# Patient Record
Sex: Female | Born: 1995
Health system: Southern US, Community
[De-identification: ages and names within clinical notes are randomized; demographics above are authoritative.]

## PROBLEM LIST (undated history)

## (undated) DIAGNOSIS — IMO0001 Reserved for inherently not codable concepts without codable children: Secondary | ICD-10-CM

## (undated) HISTORY — PX: APPENDECTOMY: SHX54

## (undated) HISTORY — DX: Reserved for inherently not codable concepts without codable children: IMO0001

---
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
## 2016-01-10 DIAGNOSIS — R208 Other disturbances of skin sensation: Secondary | ICD-10-CM | POA: Diagnosis not present

## 2016-01-10 DIAGNOSIS — B078 Other viral warts: Secondary | ICD-10-CM | POA: Diagnosis not present

## 2016-03-26 ENCOUNTER — Encounter: Payer: Self-pay | Admitting: Emergency Medicine

## 2016-03-26 DIAGNOSIS — Z885 Allergy status to narcotic agent status: Secondary | ICD-10-CM | POA: Diagnosis not present

## 2016-03-26 DIAGNOSIS — K353 Acute appendicitis with localized peritonitis: Principal | ICD-10-CM | POA: Insufficient documentation

## 2016-03-26 DIAGNOSIS — R109 Unspecified abdominal pain: Secondary | ICD-10-CM | POA: Diagnosis not present

## 2016-03-26 DIAGNOSIS — Z880 Allergy status to penicillin: Secondary | ICD-10-CM | POA: Diagnosis not present

## 2016-03-26 DIAGNOSIS — K3580 Unspecified acute appendicitis: Secondary | ICD-10-CM | POA: Diagnosis not present

## 2016-03-26 DIAGNOSIS — R1031 Right lower quadrant pain: Secondary | ICD-10-CM | POA: Diagnosis not present

## 2016-03-26 LAB — URINALYSIS, COMPLETE (UACMP) WITH MICROSCOPIC
BILIRUBIN URINE: NEGATIVE
Bacteria, UA: NONE SEEN
Glucose, UA: NEGATIVE mg/dL
HGB URINE DIPSTICK: NEGATIVE
Ketones, ur: 80 mg/dL — AB
NITRITE: NEGATIVE
PH: 5 (ref 5.0–8.0)
Protein, ur: NEGATIVE mg/dL
SPECIFIC GRAVITY, URINE: 1.023 (ref 1.005–1.030)

## 2016-03-26 LAB — COMPREHENSIVE METABOLIC PANEL
ALBUMIN: 4.7 g/dL (ref 3.5–5.0)
ALT: 18 U/L (ref 14–54)
ANION GAP: 10 (ref 5–15)
AST: 25 U/L (ref 15–41)
Alkaline Phosphatase: 48 U/L (ref 38–126)
BILIRUBIN TOTAL: 1.1 mg/dL (ref 0.3–1.2)
BUN: 13 mg/dL (ref 6–20)
CO2: 18 mmol/L — ABNORMAL LOW (ref 22–32)
Calcium: 9.8 mg/dL (ref 8.9–10.3)
Chloride: 108 mmol/L (ref 101–111)
Creatinine, Ser: 0.73 mg/dL (ref 0.44–1.00)
GFR calc Af Amer: >60 mL/min (ref >60)
GLUCOSE: 144 mg/dL — AB (ref 65–99)
POTASSIUM: 3.1 mmol/L — AB (ref 3.5–5.1)
Sodium: 136 mmol/L (ref 135–145)
TOTAL PROTEIN: 7.4 g/dL (ref 6.5–8.1)

## 2016-03-26 LAB — CBC
HEMATOCRIT: 40 % (ref 35.0–47.0)
Hemoglobin: 14 g/dL (ref 12.0–16.0)
MCH: 31.3 pg (ref 26.0–34.0)
MCHC: 35 g/dL (ref 32.0–36.0)
MCV: 89.4 fL (ref 80.0–100.0)
PLATELETS: 253 10*3/uL (ref 150–440)
RBC: 4.47 MIL/uL (ref 3.80–5.20)
RDW: 12.7 % (ref 11.5–14.5)
WBC: 19.5 10*3/uL — ABNORMAL HIGH (ref 3.6–11.0)

## 2016-03-26 LAB — LIPASE, BLOOD: LIPASE: 20 U/L (ref 11–51)

## 2016-03-26 LAB — POCT PREGNANCY, URINE: Preg Test, Ur: NEGATIVE

## 2016-03-26 NOTE — ED Triage Notes (Signed)
Pt in with co right sided abd pain, vomited x 1. Denies any n.v.d. Or dysuria, no fever.

## 2016-03-27 ENCOUNTER — Observation Stay: Payer: BLUE CROSS/BLUE SHIELD | Admitting: Anesthesiology

## 2016-03-27 ENCOUNTER — Encounter
Admission: EM | Disposition: A | Discharge status: Home / Self Care | Payer: Self-pay | Source: Home / Self Care | Attending: Emergency Medicine

## 2016-03-27 ENCOUNTER — Emergency Department: Payer: BLUE CROSS/BLUE SHIELD

## 2016-03-27 ENCOUNTER — Encounter: Payer: Self-pay | Admitting: Radiology

## 2016-03-27 ENCOUNTER — Observation Stay
Admission: EM | Admit: 2016-03-27 | Discharge: 2016-03-27 | Disposition: A | Discharge status: Home / Self Care | Payer: BLUE CROSS/BLUE SHIELD | Attending: General Surgery | Admitting: General Surgery

## 2016-03-27 DIAGNOSIS — K3580 Unspecified acute appendicitis: Secondary | ICD-10-CM | POA: Diagnosis not present

## 2016-03-27 DIAGNOSIS — K3589 Other acute appendicitis: Secondary | ICD-10-CM | POA: Diagnosis not present

## 2016-03-27 DIAGNOSIS — K353 Acute appendicitis with localized peritonitis, without perforation or gangrene: Secondary | ICD-10-CM

## 2016-03-27 DIAGNOSIS — Z885 Allergy status to narcotic agent status: Secondary | ICD-10-CM | POA: Diagnosis not present

## 2016-03-27 DIAGNOSIS — Z880 Allergy status to penicillin: Secondary | ICD-10-CM | POA: Diagnosis not present

## 2016-03-27 DIAGNOSIS — R109 Unspecified abdominal pain: Secondary | ICD-10-CM | POA: Diagnosis not present

## 2016-03-27 DIAGNOSIS — R1031 Right lower quadrant pain: Secondary | ICD-10-CM

## 2016-03-27 HISTORY — PX: LAPAROSCOPIC APPENDECTOMY: SHX408

## 2016-03-27 SURGERY — APPENDECTOMY, LAPAROSCOPIC
Anesthesia: General | Wound class: Clean Contaminated

## 2016-03-27 MED ORDER — KETOROLAC TROMETHAMINE 30 MG/ML IJ SOLN
30.0000 mg | Freq: Four times a day (QID) | INTRAMUSCULAR | Status: DC
  Administered 2016-03-27: 30 mg via INTRAVENOUS
  Filled 2016-03-27: qty 1

## 2016-03-27 MED ORDER — PHENYLEPHRINE HCL 10 MG/ML IJ SOLN
INTRAMUSCULAR | Status: DC | PRN
  Administered 2016-03-27: 100 ug via INTRAVENOUS

## 2016-03-27 MED ORDER — ONDANSETRON HCL 4 MG/2ML IJ SOLN
4.0000 mg | Freq: Once | INTRAMUSCULAR | Status: AC
  Administered 2016-03-27: 4 mg via INTRAVENOUS
  Filled 2016-03-27: qty 2

## 2016-03-27 MED ORDER — MIDAZOLAM HCL 2 MG/2ML IJ SOLN
INTRAMUSCULAR | Status: DC | PRN
  Administered 2016-03-27: 2 mg via INTRAVENOUS

## 2016-03-27 MED ORDER — OXYCODONE HCL 5 MG PO TABS: 5.0000 mg | ORAL_TABLET | ORAL | Status: DC | PRN

## 2016-03-27 MED ORDER — ROCURONIUM BROMIDE 100 MG/10ML IV SOLN
INTRAVENOUS | Status: DC | PRN
  Administered 2016-03-27: 50 mg via INTRAVENOUS

## 2016-03-27 MED ORDER — LACTATED RINGERS IV SOLN
INTRAVENOUS | Status: DC | PRN
  Administered 2016-03-27 (×2): via INTRAVENOUS

## 2016-03-27 MED ORDER — DIPHENHYDRAMINE HCL 50 MG/ML IJ SOLN
INTRAMUSCULAR | Status: AC
  Administered 2016-03-27: 12.5 mg via INTRAVENOUS
  Filled 2016-03-27: qty 1

## 2016-03-27 MED ORDER — KCL IN DEXTROSE-NACL 20-5-0.45 MEQ/L-%-% IV SOLN
INTRAVENOUS | Status: DC
  Filled 2016-03-27 (×3): qty 1000

## 2016-03-27 MED ORDER — GLYCOPYRROLATE 0.2 MG/ML IJ SOLN
INTRAMUSCULAR | Status: AC
  Filled 2016-03-27: qty 2

## 2016-03-27 MED ORDER — PROPOFOL 10 MG/ML IV BOLUS
INTRAVENOUS | Status: DC | PRN
  Administered 2016-03-27: 200 mg via INTRAVENOUS

## 2016-03-27 MED ORDER — MIDAZOLAM HCL 2 MG/2ML IJ SOLN
INTRAMUSCULAR | Status: AC
  Filled 2016-03-27: qty 2

## 2016-03-27 MED ORDER — DIPHENHYDRAMINE HCL 25 MG PO CAPS: 25.0000 mg | ORAL_CAPSULE | Freq: Four times a day (QID) | ORAL | Status: DC | PRN

## 2016-03-27 MED ORDER — SODIUM CHLORIDE 0.9 % IV SOLN: 1.0000 g | Freq: Three times a day (TID) | INTRAVENOUS | Status: DC

## 2016-03-27 MED ORDER — LIDOCAINE HCL (CARDIAC) 20 MG/ML IV SOLN
INTRAVENOUS | Status: DC | PRN
  Administered 2016-03-27: 100 mg via INTRAVENOUS

## 2016-03-27 MED ORDER — ONDANSETRON HCL 4 MG/2ML IJ SOLN
4.0000 mg | Freq: Once | INTRAMUSCULAR | Status: AC
  Administered 2016-03-27: 4 mg via INTRAVENOUS

## 2016-03-27 MED ORDER — IOPAMIDOL (ISOVUE-300) INJECTION 61%
100.0000 mL | Freq: Once | INTRAVENOUS | Status: AC | PRN
  Administered 2016-03-27: 100 mL via INTRAVENOUS

## 2016-03-27 MED ORDER — BUPIVACAINE-EPINEPHRINE (PF) 0.5% -1:200000 IJ SOLN
INTRAMUSCULAR | Status: DC | PRN
  Administered 2016-03-27: 30 mL

## 2016-03-27 MED ORDER — PROPOFOL 10 MG/ML IV BOLUS
INTRAVENOUS | Status: AC
  Filled 2016-03-27: qty 20

## 2016-03-27 MED ORDER — ONDANSETRON HCL 4 MG/2ML IJ SOLN: 4.0000 mg | Freq: Four times a day (QID) | INTRAMUSCULAR | Status: DC | PRN

## 2016-03-27 MED ORDER — DIPHENHYDRAMINE HCL 50 MG/ML IJ SOLN
12.5000 mg | Freq: Once | INTRAMUSCULAR | Status: AC
  Administered 2016-03-27: 12.5 mg via INTRAVENOUS

## 2016-03-27 MED ORDER — IBUPROFEN 600 MG PO TABS: 600.0000 mg | ORAL_TABLET | Freq: Three times a day (TID) | ORAL | 0 refills | Status: DC | PRN

## 2016-03-27 MED ORDER — FENTANYL CITRATE (PF) 100 MCG/2ML IJ SOLN
INTRAMUSCULAR | Status: DC | PRN
  Administered 2016-03-27: 150 ug via INTRAVENOUS
  Administered 2016-03-27: 100 ug via INTRAVENOUS

## 2016-03-27 MED ORDER — OXYCODONE HCL 5 MG PO TABS: 5.0000 mg | ORAL_TABLET | ORAL | 0 refills | Status: DC | PRN

## 2016-03-27 MED ORDER — KETOROLAC TROMETHAMINE 30 MG/ML IJ SOLN
INTRAMUSCULAR | Status: DC | PRN
  Administered 2016-03-27: 30 mg via INTRAVENOUS

## 2016-03-27 MED ORDER — ONDANSETRON HCL 4 MG/2ML IJ SOLN
INTRAMUSCULAR | Status: DC | PRN
Start: 2016-03-27 — End: 2016-03-27
  Administered 2016-03-27: 4 mg via INTRAVENOUS

## 2016-03-27 MED ORDER — HYDROMORPHONE HCL 1 MG/ML IJ SOLN
INTRAMUSCULAR | Status: AC
  Filled 2016-03-27: qty 1

## 2016-03-27 MED ORDER — KCL IN DEXTROSE-NACL 20-5-0.45 MEQ/L-%-% IV SOLN
INTRAVENOUS | Status: DC
  Administered 2016-03-27: 12:00:00 via INTRAVENOUS
  Filled 2016-03-27: qty 1000

## 2016-03-27 MED ORDER — MEROPENEM-SODIUM CHLORIDE 1 GM/50ML IV SOLR
1.0000 g | Freq: Three times a day (TID) | INTRAVENOUS | Status: DC
  Administered 2016-03-27: 1 g via INTRAVENOUS
  Filled 2016-03-27 (×4): qty 50

## 2016-03-27 MED ORDER — FENTANYL CITRATE (PF) 100 MCG/2ML IJ SOLN: 25.0000 ug | INTRAMUSCULAR | Status: DC | PRN

## 2016-03-27 MED ORDER — ONDANSETRON HCL 4 MG/2ML IJ SOLN: 4.0000 mg | Freq: Once | INTRAMUSCULAR | Status: DC | PRN

## 2016-03-27 MED ORDER — ONDANSETRON 4 MG PO TBDP: 4.0000 mg | ORAL_TABLET | Freq: Four times a day (QID) | ORAL | Status: DC | PRN

## 2016-03-27 MED ORDER — HYDROMORPHONE HCL 1 MG/ML IJ SOLN
INTRAMUSCULAR | Status: DC | PRN
  Administered 2016-03-27: 1 mg via INTRAVENOUS

## 2016-03-27 MED ORDER — SUCCINYLCHOLINE CHLORIDE 200 MG/10ML IV SOSY
PREFILLED_SYRINGE | INTRAVENOUS | Status: AC
  Filled 2016-03-27: qty 10

## 2016-03-27 MED ORDER — IOPAMIDOL (ISOVUE-300) INJECTION 61%
30.0000 mL | Freq: Once | INTRAVENOUS | Status: AC | PRN
  Administered 2016-03-27: 30 mL via ORAL

## 2016-03-27 MED ORDER — SODIUM CHLORIDE 0.9 % IV SOLN
30.0000 meq | Freq: Once | INTRAVENOUS | Status: AC
  Administered 2016-03-27: 30 meq via INTRAVENOUS
  Filled 2016-03-27 (×2): qty 15

## 2016-03-27 MED ORDER — ONDANSETRON HCL 4 MG/2ML IJ SOLN
INTRAMUSCULAR | Status: AC
  Administered 2016-03-27: 4 mg via INTRAVENOUS
  Filled 2016-03-27: qty 2

## 2016-03-27 MED ORDER — FENTANYL CITRATE (PF) 100 MCG/2ML IJ SOLN
INTRAMUSCULAR | Status: AC
  Administered 2016-03-27: 25 ug via INTRAVENOUS
  Filled 2016-03-27: qty 2

## 2016-03-27 MED ORDER — HYDROMORPHONE HCL 1 MG/ML IJ SOLN: 0.5000 mg | INTRAMUSCULAR | Status: DC | PRN

## 2016-03-27 MED ORDER — FENTANYL CITRATE (PF) 100 MCG/2ML IJ SOLN
25.0000 ug | Freq: Once | INTRAMUSCULAR | Status: AC
  Administered 2016-03-27: 25 ug via INTRAVENOUS

## 2016-03-27 MED ORDER — SODIUM CHLORIDE 0.9 % IV BOLUS (SEPSIS)
500.0000 mL | Freq: Once | INTRAVENOUS | Status: AC
  Administered 2016-03-27: 500 mL via INTRAVENOUS

## 2016-03-27 MED ORDER — ROCURONIUM BROMIDE 50 MG/5ML IV SOSY
PREFILLED_SYRINGE | INTRAVENOUS | Status: AC
  Filled 2016-03-27: qty 5

## 2016-03-27 MED ORDER — LIDOCAINE 2% (20 MG/ML) 5 ML SYRINGE
INTRAMUSCULAR | Status: AC
  Filled 2016-03-27: qty 5

## 2016-03-27 MED ORDER — MORPHINE SULFATE (PF) 2 MG/ML IV SOLN
2.0000 mg | Freq: Once | INTRAVENOUS | Status: AC
  Administered 2016-03-27: 2 mg via INTRAVENOUS
  Filled 2016-03-27: qty 1

## 2016-03-27 MED ORDER — SUGAMMADEX SODIUM 200 MG/2ML IV SOLN
INTRAVENOUS | Status: DC | PRN
  Administered 2016-03-27: 100 mg via INTRAVENOUS

## 2016-03-27 MED ORDER — FENTANYL CITRATE (PF) 250 MCG/5ML IJ SOLN
INTRAMUSCULAR | Status: AC
  Filled 2016-03-27: qty 5

## 2016-03-27 MED ORDER — DIPHENHYDRAMINE HCL 50 MG/ML IJ SOLN: 12.5000 mg | Freq: Once | INTRAMUSCULAR | Status: DC

## 2016-03-27 MED ORDER — DIPHENHYDRAMINE HCL 50 MG/ML IJ SOLN: 25.0000 mg | Freq: Four times a day (QID) | INTRAMUSCULAR | Status: DC | PRN

## 2016-03-27 SURGICAL SUPPLY — 40 items
ADH SKN CLS APL DERMABOND .7 (GAUZE/BANDAGES/DRESSINGS) ×1
APPLIER CLIP LOGIC TI 5 (MISCELLANEOUS) ×2 IMPLANT
CANISTER SUCT 1200ML W/VALVE (MISCELLANEOUS) ×2 IMPLANT
CHLORAPREP W/TINT 26ML (MISCELLANEOUS) ×2 IMPLANT
CUTTER FLEX LINEAR 45M (STAPLE) ×2 IMPLANT
DERMABOND ADVANCED (GAUZE/BANDAGES/DRESSINGS) ×1
DERMABOND ADVANCED .7 DNX12 (GAUZE/BANDAGES/DRESSINGS) ×1 IMPLANT
ELECT CAUTERY BLADE 6.4 (BLADE) ×2 IMPLANT
ELECT REM PT RETURN 9FT ADLT (ELECTROSURGICAL) ×2
ELECTRODE REM PT RTRN 9FT ADLT (ELECTROSURGICAL) ×1 IMPLANT
ENDOPOUCH RETRIEVER 10 (MISCELLANEOUS) ×2 IMPLANT
GLOVE SURG SYN 7.0 (GLOVE) ×6 IMPLANT
GLOVE SURG SYN 7.5  E (GLOVE) ×3
GLOVE SURG SYN 7.5 E (GLOVE) ×3 IMPLANT
GOWN STRL REUS W/ TWL LRG LVL3 (GOWN DISPOSABLE) ×2 IMPLANT
GOWN STRL REUS W/TWL LRG LVL3 (GOWN DISPOSABLE) ×2
IV NS 1000ML (IV SOLUTION) ×2
IV NS 1000ML BAXH (IV SOLUTION) ×1 IMPLANT
KIT RM TURNOVER STRD PROC AR (KITS) ×2 IMPLANT
LABEL OR SOLS (LABEL) ×2 IMPLANT
LIGASURE MARYLAND LAP STAND (ELECTROSURGICAL) ×2 IMPLANT
LIQUID BAND (GAUZE/BANDAGES/DRESSINGS) ×2 IMPLANT
NDL HPO THNWL 1X22GA REG BVL (NEEDLE) ×1 IMPLANT
NEEDLE SAFETY 22GX1 (NEEDLE) ×1
NS IRRIG 500ML POUR BTL (IV SOLUTION) ×2 IMPLANT
PACK LAP CHOLECYSTECTOMY (MISCELLANEOUS) ×2 IMPLANT
PENCIL ELECTRO HAND CTR (MISCELLANEOUS) ×2 IMPLANT
RELOAD 45 VASCULAR/THIN (ENDOMECHANICALS) ×2 IMPLANT
RELOAD STAPLE TA45 3.5 REG BLU (ENDOMECHANICALS) ×2 IMPLANT
SCISSORS METZENBAUM CVD 33 (INSTRUMENTS) ×2 IMPLANT
SET SUCTION IRRIG HYDROSURG (IRRIGATION / IRRIGATOR) ×2 IMPLANT
SLEEVE ADV FIXATION 5X100MM (TROCAR) ×4 IMPLANT
SUT MNCRL 4-0 (SUTURE) ×1
SUT MNCRL 4-0 27XMFL (SUTURE) ×1
SUT VICRYL 0 AB UR-6 (SUTURE) ×2 IMPLANT
SUTURE MNCRL 4-0 27XMF (SUTURE) ×1 IMPLANT
TRAY FOLEY W/METER SILVER 16FR (SET/KITS/TRAYS/PACK) ×2 IMPLANT
TROCAR XCEL BLUNT TIP 100MML (ENDOMECHANICALS) ×2 IMPLANT
TROCAR Z-THREAD OPTICAL 5X100M (TROCAR) ×2 IMPLANT
TUBING INSUFFLATOR HI FLOW (MISCELLANEOUS) ×2 IMPLANT

## 2016-03-27 NOTE — Anesthesia Preprocedure Evaluation (Signed)
Anesthesia Evaluation  Patient identified by MRN, date of birth, ID band Patient awake    Reviewed: Allergy & Precautions, NPO status , Patient's Chart, lab work & pertinent test results  History of Anesthesia Complications Negative for: history of anesthetic complications  Airway Mallampati: II       Dental   Pulmonary neg pulmonary ROS,           Cardiovascular negative cardio ROS       Neuro/Psych negative neurological ROS     GI/Hepatic negative GI ROS, Neg liver ROS,   Endo/Other  negative endocrine ROS  Renal/GU negative Renal ROS     Musculoskeletal   Abdominal   Peds  Hematology negative hematology ROS (+)   Anesthesia Other Findings   Reproductive/Obstetrics                             Anesthesia Physical Anesthesia Plan  ASA: I and emergent  Anesthesia Plan: General   Post-op Pain Management:    Induction:   Airway Management Planned: Oral ETT  Additional Equipment:   Intra-op Plan:   Post-operative Plan:   Informed Consent: I have reviewed the patients History and Physical, chart, labs and discussed the procedure including the risks, benefits and alternatives for the proposed anesthesia with the patient or authorized representative who has indicated his/her understanding and acceptance.     Plan Discussed with:   Anesthesia Plan Comments:         Anesthesia Quick Evaluation

## 2016-03-27 NOTE — Anesthesia Procedure Notes (Signed)
Procedure Name: Intubation Date/Time: 03/27/2016 8:05 AM Performed by: Rosaria Ferries, Lorayne Getchell Pre-anesthesia Checklist: Patient identified, Emergency Drugs available, Suction available and Patient being monitored Oxygen Delivery Method: Circle system utilized Preoxygenation: Pre-oxygenation with 100% oxygen Intubation Type: IV induction Laryngoscope Size: Mac and 3 Grade View: Grade I Tube size: 7.0 mm Number of attempts: 1 Secured at: 21 cm Tube secured with: Tape Dental Injury: Teeth and Oropharynx as per pre-operative assessment

## 2016-03-27 NOTE — Progress Notes (Signed)
Pharmacy Antibiotic Note  Joanie CoddingtonKacey R Diliberto is a [age] y.o. female admitted on 03/27/2016 with IAI.  Pharmacy has been consulted for meropenem dosing.  Plan: Meropenem 1 gm IV Q8H. Patient has PCN allergy - hives. Low cross-reactivity between PCN and carbapenems.  Height: 5\' 8"  (172.7 cm) Weight: 135 lb (61.2 kg) IBW/kg (Calculated) : 63.9  Temp (24hrs), Avg:98.5 F (36.9 C), Min:98.2 F (36.8 C), Max:98.7 F (37.1 C)   Recent Labs Lab 03/26/16 2248  WBC 19.5*  CREATININE 0.73    Estimated Creatinine Clearance: 109.3 mL/min (by C-G formula based on SCr of 0.73 mg/dL).    Allergies  Allergen Reactions  . Morphine And Related Hives  . Penicillins Rash   Thank you for allowing pharmacy to be a part of this patient's care.  Carola FrostNathan A Godson Pollan, Pharm.D., BCPS Clinical Pharmacist 03/27/2016 5:58 AM

## 2016-03-27 NOTE — ED Notes (Signed)
Pt. Returned to tx. room in stable condition with no acute changes since departure from unit for scans.   

## 2016-03-27 NOTE — Discharge Summary (Signed)
Patient ID: Joanie CoddingtonKacey R [place] MRN: [id number] DOB/AGE: 06/07/1995 21 y.o.  Admit date: 03/27/2016 Discharge date: 03/27/2016   Discharge Diagnoses:  Active Problems:   Acute appendicitis   Acute appendicitis with localized peritonitis   Procedures: laparoscopic appendectomy  Hospital Course: patient was admitted on 03/27/16 with acute appendicitis and was taken to the operating room for a laparoscopic appendectomy.  She tolerated the procedure well and there were no complications.  Her diet was advanced and she tolerated without nausea or vomiting.  Her pain was well controlled and she was voiding without issues.  She was deemed ready for discharge.  Consults: None  Disposition:  Home, self care  Discharge Instructions    Call MD for:  difficulty breathing, headache or visual disturbances    Complete by:  As directed    Call MD for:  persistant nausea and vomiting    Complete by:  As directed    Call MD for:  redness, tenderness, or signs of infection (pain, swelling, redness, odor or green/yellow discharge around incision site)    Complete by:  As directed    Call MD for:  severe uncontrolled pain    Complete by:  As directed    Call MD for:  temperature >100.4    Complete by:  As directed    Diet - low sodium heart healthy    Complete by:  As directed    Discharge instructions    Complete by:  As directed    Patient may shower but do not scrub wounds heavily and dab dry only.  Do not apply ointments or hydrogen peroxide to the wounds.   Driving Restrictions    Complete by:  As directed    Do not drive while taking narcotics for pain control.   Increase activity slowly    Complete by:  As directed    Lifting restrictions    Complete by:  As directed    No heavy lifting of more than 10-15 lbs for 4 weeks.   No dressing needed    Complete by:  As directed      Allergies as of 03/27/2016      Reactions   Morphine And Related Hives   Penicillins Rash      Medication List     TAKE these medications   ibuprofen 600 MG tablet Commonly known as:  ADVIL,MOTRIN Take 1 tablet (600 mg total) by mouth every 8 (eight) hours as needed for fever, mild pain or moderate pain.   oxyCODONE 5 MG immediate release tablet Commonly known as:  Oxy IR/ROXICODONE Take 1 tablet (5 mg total) by mouth every 4 (four) hours as needed for moderate pain or severe pain.      Follow-up Information    Henrene DodgeJose Monty Mccarrell, MD Follow up in 2 week(s).   Specialty:  Surgery Contact information: [address]  [address] [place] [id number] [phone]

## 2016-03-27 NOTE — ED Provider Notes (Signed)
[facility] Emergency Department Provider Note   ____________________________________________   First MD Initiated Contact with Patient 03/27/16 0113     (approximate)  I have reviewed the triage vital signs and the nursing notes.   HISTORY  Chief Complaint Abdominal Pain    HPI Diana Moss is a 21 y.o. female who presents to the ED from home with chief complaint of abdominal pain. Patient reports onset of right-sided abdominal pain approximately noon (13 hours ago). Describes right upper abdominal pain radiating to umbilicus and right lower quadrant. Vomited once. Remains nauseous. Unable to eat secondary to nausea and lack of appetite. Denies associated fever, chills, chest pain, shortness of breath, dysuria, diarrhea. Denies recent travel trauma. Nothing makes her symptoms better or worse.   Past medical history None  There are no active problems to display for this patient.   Past surgical history None  Prior to Admission medications   Not on File    Allergies Morphine and related and Penicillins  No family history on file.  Social History Social History  Substance Use Topics  . Smoking status: Not on file  . Smokeless tobacco: Not on file  . Alcohol use Not on file    Review of Systems  Constitutional: No fever/chills. Eyes: No visual changes. ENT: No sore throat. Cardiovascular: Denies chest pain. Respiratory: Denies shortness of breath. Gastrointestinal: Positive for abdominal pain.  Positive for nausea and vomiting.  No diarrhea.  No constipation. Genitourinary: Negative for dysuria. Musculoskeletal: Negative for back pain. Skin: Negative for rash. Neurological: Negative for headaches, focal weakness or numbness.  10-point ROS otherwise negative.  ____________________________________________   PHYSICAL EXAM:  VITAL SIGNS: ED Triage Vitals  Enc Vitals Group     BP 03/26/16 2251 127/73     Pulse Rate 03/26/16  2251 91     Resp 03/26/16 2251 18     Temp 03/26/16 2251 98.2 F (36.8 C)     Temp Source 03/26/16 2251 Oral     SpO2 03/26/16 2251 100 %     Weight 03/26/16 2249 135 lb (61.2 kg)     Height 03/26/16 2249 5\' 8"  (1.727 m)     Head Circumference --      Peak Flow --      Pain Score 03/26/16 2249 7     Pain Loc --      Pain Edu? --      Excl. in GC? --     Constitutional: Alert and oriented. Well appearing and in no acute distress. Eyes: Conjunctivae are normal. PERRL. EOMI. Head: Atraumatic. Nose: No congestion/rhinnorhea. Mouth/Throat: Mucous membranes are moist.  Oropharynx non-erythematous. Neck: No stridor.   Cardiovascular: Normal rate, regular rhythm. Grossly normal heart sounds.  Good peripheral circulation. Respiratory: Normal respiratory effort.  No retractions. Lungs CTAB. Gastrointestinal: Soft and mildly tender to palpation umbilicus and right lower quadrant without rebound or guarding. No distention. No abdominal bruits. No CVA tenderness. Musculoskeletal: No lower extremity tenderness nor edema.  No joint effusions. Neurologic:  Normal speech and language. No gross focal neurologic deficits are appreciated. No gait instability. Skin:  Skin is warm, dry and intact. No rash noted. Psychiatric: Mood and affect are normal. Speech and behavior are normal.  ____________________________________________   LABS (all labs ordered are listed, but only abnormal results are displayed)  Labs Reviewed  CBC - Abnormal; Notable for the following:       Result Value   WBC 19.5 (*)    All  other components within normal limits  COMPREHENSIVE METABOLIC PANEL - Abnormal; Notable for the following:    Potassium 3.1 (*)    CO2 18 (*)    Glucose, Bld 144 (*)    All other components within normal limits  URINALYSIS, COMPLETE (UACMP) WITH MICROSCOPIC - Abnormal; Notable for the following:    Color, Urine YELLOW (*)    APPearance CLEAR (*)    Ketones, ur 80 (*)    Leukocytes, UA  TRACE (*)    Squamous Epithelial / LPF 6-30 (*)    All other components within normal limits  LIPASE, BLOOD  POCT PREGNANCY, URINE  POC URINE PREG, ED   ____________________________________________  EKG  None ____________________________________________  RADIOLOGY  CT abdomen and pelvis discussed with Dr. Jake Samples:  1. Findings consistent with acute appendicitis. No free air is seen.  A portion of the appendix is retrocecal.  2. Small amount of free fluid in the pelvis.   ____________________________________________   PROCEDURES  Procedure(s) performed: None  Procedures  Critical Care performed: No  ____________________________________________   INITIAL IMPRESSION / ASSESSMENT AND PLAN / ED COURSE  Pertinent labs & imaging results that were available during my care of the patient were reviewed by me and considered in my medical decision making (see chart for details).  21 year old female who presents with right sided abdominal pain, maximally at McBurney's point. Laboratory results notable for leukocytosis. Clinical exam and lab work concerning for acute appendicitis. Also considered in the differential diagnosis pelvic pathology (which patient does not have a history of), kidney stones (which patient does not have a history of), mesenteric adenitis. Will proceed with CT abdomen/pelvis.  Clinical Course as of Mar 27 440  Wed Mar 27, 2016  0144 Itching and hives after morphine administration. No airway involvement. IV Benadryl ordered.  [JS]  0406 Updated patient and mother of CT imaging results consistent with acute appendicitis. Will discuss with general surgery to evaluate patient in the emergency department for admission.  [JS]    Clinical Course User Index [JS] Irean Hong, MD     ____________________________________________   FINAL CLINICAL IMPRESSION(S) / ED DIAGNOSES  Final diagnoses:  Right lower quadrant abdominal pain  Acute appendicitis,  unspecified acute appendicitis type      NEW MEDICATIONS STARTED DURING THIS VISIT:  New Prescriptions   No medications on file     Note:  This document was prepared using Dragon voice recognition software and may include unintentional dictation errors.    Irean Hong, MD 03/27/16 [phone]

## 2016-03-27 NOTE — ED Notes (Signed)
Pt denies any itching at this time.

## 2016-03-27 NOTE — Progress Notes (Signed)
All discharge teaching completed with pt and her mom; iv removed; prescriptions given to pt; pt discharged at this time and going home; pt via wheelchair escorted by NT to car; pt's father here to drive her home

## 2016-03-27 NOTE — Transfer of Care (Signed)
Immediate Anesthesia Transfer of Care Note  Patient: Diana Moss  Procedure(s) Performed: Procedure(s): APPENDECTOMY LAPAROSCOPIC (N/A)  Patient Location: PACU  Anesthesia Type:General  Level of Consciousness: awake, alert , oriented and patient cooperative  Airway & Oxygen Therapy: Patient Spontanous Breathing and Patient connected to nasal cannula oxygen  Post-op Assessment: Report given to RN and Post -op Vital signs reviewed and stable  Post vital signs: Reviewed and stable  Last Vitals:  Vitals:   03/27/16 0530 03/27/16 0557  BP: 105/70 121/75  Pulse:  (!) 118  Resp:  18  Temp:  37.1 C    Last Pain:  Vitals:   03/27/16 0557  TempSrc: Oral  PainSc:          Complications: No apparent anesthesia complications

## 2016-03-27 NOTE — Anesthesia Postprocedure Evaluation (Signed)
Anesthesia Post Note  Patient: Diana Moss  Procedure(s) Performed: Procedure(s) (LRB): APPENDECTOMY LAPAROSCOPIC (N/A)  Patient location during evaluation: PACU Anesthesia Type: General Level of consciousness: awake and alert Pain management: pain level controlled Vital Signs Assessment: post-procedure vital signs reviewed and stable Respiratory status: spontaneous breathing and respiratory function stable Cardiovascular status: stable Anesthetic complications: no     Last Vitals:  Vitals:   03/27/16 0925 03/27/16 0935  BP: 122/61   Pulse: (!) 112 (!) 108  Resp: 10 18  Temp: 36.8 C     Last Pain:  Vitals:   03/27/16 0557  TempSrc: Oral  PainSc:                  Jahniah Pallas K

## 2016-03-27 NOTE — ED Notes (Signed)
Pt complains of nausea and increased pian.MD made aware.

## 2016-03-27 NOTE — Op Note (Signed)
  Procedure Date:  03/27/2016  Pre-operative Diagnosis:  Acute appendicitis  Post-operative Diagnosis:  Acute appendicitis  Procedure:  Laparoscopic appendectomy  Surgeon:  Howie IllJose Luis Yusef Lamp, MD  Anesthesia:  General endotracheal  Estimated Blood Loss:  25 ml  Specimens:  appendix  Complications:  None  Indications for Procedure:  This is a 21 y.o. female who presents with abdominal pain and workup revealing acute appendicitis.  The options of surgery versus observation were reviewed with the patient and/or family. The risks of bleeding, infection, recurrence of symptoms, negative laparoscopy, potential for an open procedure, bowel injury, abscess or infection, were all discussed with the patient and she was willing to proceed.  Description of Procedure: The patient was correctly identified in the preoperative area and brought into the operating room.  The patient was placed supine with VTE prophylaxis in place.  Appropriate time-outs were performed.  Anesthesia was induced and the patient was intubated.  Foley catheter was placed.  Appropriate antibiotics were infused.  The abdomen was prepped and draped in a sterile fashion. An infraumbilical incision was made. A cutdown technique was used to enter the abdominal cavity without injury, and a Hasson trocar was inserted.  Pneumoperitoneum was obtained with appropriate opening pressures.  Two 5-mm ports were placed in the suprapubic and left lateral positions under direct visualization.  The right lower quadrant was inspected and the appendix was identified and found to be acutely inflamed.  The appendix was carefully dissected. The base of the appendix was dissected out and divided with a blue load Endo GIA. The mesoappendix was divided using a white load Endo GIA.  The appendix was placed in an Endocatch bag and brought out through the umbilical incision.  The right lower quadrant was then inspected again revealing an intact staple line over  the appendiceal base, with an area of bleeding from the mesenteric staple line, which was controlled using endo clips.  After further inspection, there was no bleeding, and no bowel injury.  The area was thoroughly irrigated.  The 5 mm ports were removed under direct visualization and the Hasson trocar was removed.  The fascial opening was closed using 0 vicryl suture.  Local anesthetic was infused in all incisions and the incisions were closed with 4-0 Monocryl.  The wounds were cleaned and sealed with DermaBond.  Foley catheter was removed and the patient was emerged from anesthesia and extubated and brought to the recovery room for further management.  The patient tolerated the procedure well and all counts were correct at the end of the case.   Howie IllJose Luis Witney Huie, MD

## 2016-03-27 NOTE — H&P (Signed)
Patient ID: Diana Moss, female   DOB: 05-08-1995, 21 y.o.   MRN: [id number]  CC: ABDOMINAL PAIN  HPI Diana Moss is a 21 y.o. female presents to emergency department with a one-day history of abdominal pain. The patient started the midepigastric region and gradually moved to the right lower quadrant. She's never had pain like this before. The pain gradually progressed throughout the day and was associated with a decreased appetite and oral intake. She developed nausea and vomiting in the evening which prompted her mother to bring her to the hospital. The pain only improved with medication given in the ER. The pain is described as sharp and stabbing. It is worsened with movement or pressing in the area. She reports nausea, vomiting, but denies fevers, chills, chest pain, shortness of breath, diarrhea, constipation. She is otherwise in her usual state of health and his home from college for winter break currently.  HPI  Past medical history: No chronic medications  Past surgical history: No prior abdominal surgeries  Family history: No family history of heart disease, diabetes, cancers.  Social History Social History  Substance Use Topics  . Smoking status: Not on file  . Smokeless tobacco: Not on file  . Alcohol use Not on file  Patient denies tobacco or alcohol use.  Allergies  Allergen Reactions  . Morphine And Related Hives  . Penicillins Rash    Current Facility-Administered Medications  Medication Dose Route Frequency Provider Last Rate Last Dose  . meropenem (MERREM) 1 g in sodium chloride 0.9 % 100 mL IVPB  1 g Intravenous Q8H Ricarda Frame, MD      . potassium chloride 30 mEq in sodium chloride 0.9 % 265 mL (KCL MULTIRUN) IVPB  30 mEq Intravenous Once Ricarda Frame, MD       No current outpatient prescriptions on file.     Review of Systems A Multi-point review of systems was asked and was negative except for the findings documented in the history of present  illness  Physical Exam Blood pressure 114/73, pulse 97, temperature 98.2 F (36.8 C), temperature source Oral, resp. rate 17, height 5\' 8"  (1.727 m), weight 61.2 kg (135 lb), last menstrual period 03/08/2016, SpO2 100 %. CONSTITUTIONAL: Resting in bed in no acute distress. EYES: Pupils are equal, round, and reactive to light, Sclera are non-icteric. EARS, NOSE, MOUTH AND THROAT: The oropharynx is clear. The oral mucosa is pink and moist. Hearing is intact to voice. LYMPH NODES:  Lymph nodes in the neck are normal. RESPIRATORY:  Lungs are clear. There is normal respiratory effort, with equal breath sounds bilaterally, and without pathologic use of accessory muscles. CARDIOVASCULAR: Heart is regular without murmurs, gallops, or rubs. GI: The abdomen is soft, tender to palpation in the right lower quadrant at McBurney's point with positive Rovsing sign but negative heel tap or psoas, and nondistended. There are no palpable masses. There is no hepatosplenomegaly. There are normal bowel sounds in all quadrants. GU: Rectal deferred.   MUSCULOSKELETAL: Normal muscle strength and tone. No cyanosis or edema.   SKIN: Turgor is good and there are no pathologic skin lesions or ulcers. NEUROLOGIC: Motor and sensation is grossly normal. Cranial nerves are grossly intact. PSYCH:  Oriented to person, place and time. Affect is normal.  Data Reviewed Images and labs reviewed. Labs are concerning for a leukocytosis of 19,500, and a hypokalemia of 3.1. CT scan of the abdomen shows a dilated, thickened appendix in the retrocecal position. There is little bit of  free fluid in the pelvis but no evidence of free air or abscess. I have personally reviewed the patient's imaging, laboratory findings and medical records.    Assessment    Acute appendicitis    Plan    21 year old female with acute appendicitis discussed with the patient and her mother the treatment options of appendectomy versus IV antibiotics.  Risks, benefits, alternatives of each were described in detail. After his conversation with patient and the mother elected to proceed with surgery remove her appendix. Described the operation in detail and they voiced understanding. Discussed that depending on the time of her surgery and he performed either by myself or my partner Dr. Aleen CampiPiscoya. Given her hypokalemia discussed that we would start IV antibiotics and potassium replacement prior to proceeding to the operating room. Her length of stay in the hospital will depend on the findings of the time of surgery. Likely home after an overnight stay. We'll bring him to the hospital under observation and post for surgery later this morning. All questions answered to the satisfaction of the patient and her mother and her excepting of the plans.      Time spent with the patient was 45 minutes, with more than 50% of the time spent in face-to-face education, counseling and care coordination.     Ricarda Frameharles Rilen Shukla, MD FACS General Surgeon 03/27/2016, 5:06 AM

## 2016-03-27 NOTE — ED Notes (Signed)
Pt complaining of left arm itching. Visual rash noted to arm. MD Dolores FrameSung made aware.

## 2016-03-28 LAB — SURGICAL PATHOLOGY

## 2016-04-12 ENCOUNTER — Ambulatory Visit (INDEPENDENT_AMBULATORY_CARE_PROVIDER_SITE_OTHER): Payer: BLUE CROSS/BLUE SHIELD | Admitting: Surgery

## 2016-04-12 ENCOUNTER — Encounter: Payer: Self-pay | Admitting: Surgery

## 2016-04-12 VITALS — BP 116/79 | HR 80 | Temp 98.2°F | Ht 68.0 in | Wt 151.2 lb

## 2016-04-12 DIAGNOSIS — Z09 Encounter for follow-up examination after completed treatment for conditions other than malignant neoplasm: Secondary | ICD-10-CM

## 2016-04-12 NOTE — Patient Instructions (Signed)
Please call our office if you have any questions or concerns.  

## 2016-04-12 NOTE — Progress Notes (Signed)
S/p lap appy 1/3 by Dr. Aleen CampiPiscoya Path d/w pt and family No complaints Doing very well  PE : NAD Abd: soft, Nt ,incisions c/d/i, no infection  A/P doing well No heavy lifting  RTC prn

## 2016-08-20 ENCOUNTER — Ambulatory Visit (INDEPENDENT_AMBULATORY_CARE_PROVIDER_SITE_OTHER): Payer: BLUE CROSS/BLUE SHIELD | Admitting: Internal Medicine

## 2016-08-20 ENCOUNTER — Encounter: Payer: Self-pay | Admitting: Internal Medicine

## 2016-08-20 ENCOUNTER — Encounter (INDEPENDENT_AMBULATORY_CARE_PROVIDER_SITE_OTHER): Payer: Self-pay

## 2016-08-20 DIAGNOSIS — Z0000 Encounter for general adult medical examination without abnormal findings: Secondary | ICD-10-CM | POA: Diagnosis not present

## 2016-08-20 NOTE — Assessment & Plan Note (Signed)
Here to establish care today.  Obtain records from Dr Tracey HarriesPringle.

## 2016-08-20 NOTE — Progress Notes (Signed)
Patient ID: Diana Moss, female   DOB: 1996-03-08, 21 y.o.   MRN: [id number]   Subjective:    Patient ID: Diana Moss, female    DOB: 28-Nov-1995, 21 y.o.   MRN: [id number]  HPI  Patient here to establish care.  She has been seen at [facility] - Dr Tracey Harries.  Has been healthy.  Had her appendix removed in January.  Has done well since her surgery.  No allergy or sinus issues.  No history of asthma.  No acid reflux.  No abdominal pain or cramping. Bowels moving.  In college - [employer].  Doing well.  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  Menarche 6th grade.  Regular periods.     Past Medical History:  Diagnosis Date  . Healthy adolescent    Past Surgical History:  Procedure Laterality Date  . APPENDECTOMY    . LAPAROSCOPIC APPENDECTOMY N/A 03/27/2016   Procedure: APPENDECTOMY LAPAROSCOPIC;  Surgeon: Henrene Dodge, MD;  Location: [facility] ORS;  Service: General;  Laterality: N/A;   Family History  Problem Relation Age of Onset  . Breast cancer Maternal Grandfather        Maternal GGM and Maternal Great aunt   . Lung cancer Paternal Grandmother   . Alcohol abuse Neg Hx   . Arthritis Neg Hx   . Asthma Neg Hx   . Birth defects Neg Hx   . Cancer Neg Hx   . COPD Neg Hx   . Depression Neg Hx   . Diabetes Neg Hx   . Drug abuse Neg Hx   . Early death Neg Hx   . Hearing loss Neg Hx   . Heart disease Neg Hx   . Hyperlipidemia Neg Hx   . Hypertension Neg Hx   . Kidney disease Neg Hx   . Learning disabilities Neg Hx   . Mental illness Neg Hx   . Mental retardation Neg Hx   . Miscarriages / Stillbirths Neg Hx   . Stroke Neg Hx   . Vision loss Neg Hx   . Varicose Veins Neg Hx    Social History   Social History  . Marital status: Single    Spouse name: N/A  . Number of children: N/A  . Years of education: N/A   Social History Main Topics  . Smoking status: Never Smoker  . Smokeless tobacco: Never Used  . Alcohol use No  . Drug use: No  . Sexual activity: No   Other  Topics Concern  . None   Social History Narrative  . None    No outpatient encounter prescriptions on file as of 08/20/2016.   No facility-administered encounter medications on file as of 08/20/2016.     Review of Systems  Constitutional: Negative for appetite change and unexpected weight change.  HENT: Negative for congestion and sinus pressure.   Respiratory: Negative for cough, chest tightness and shortness of breath.   Cardiovascular: Negative for chest pain, palpitations and leg swelling.  Gastrointestinal: Negative for abdominal pain, diarrhea, nausea and vomiting.  Genitourinary: Negative for difficulty urinating and dysuria.  Musculoskeletal: Negative for back pain and joint swelling.  Skin: Negative for color change and rash.  Neurological: Negative for dizziness, light-headedness and headaches.  Psychiatric/Behavioral: Negative for agitation and dysphoric mood.       Objective:    Physical Exam  Constitutional: She appears well-developed and well-nourished. No distress.  HENT:  Nose: Nose normal.  Mouth/Throat: Oropharynx is clear and  moist.  Neck: Neck supple. No thyromegaly present.  Cardiovascular: Normal rate and regular rhythm.   Pulmonary/Chest: Breath sounds normal. No respiratory distress. She has no wheezes.  Abdominal: Soft. Bowel sounds are normal. There is no tenderness.  Musculoskeletal: She exhibits no edema or tenderness.  Lymphadenopathy:    She has no cervical adenopathy.  Skin: No rash noted. No erythema.  Psychiatric: She has a normal mood and affect. Her behavior is normal.    BP 110/74 (BP Location: Left Arm, Patient Position: Sitting, Cuff Size: Normal)   Pulse (!) 58   Temp 98.7 F (37.1 C) (Oral)   Resp 12   Ht 5' 7.72" (1.72 m)   Wt 152 lb 3.2 oz (69 kg)   LMP 08/04/2016   SpO2 99%   BMI 23.34 kg/m  Wt Readings from Last 3 Encounters:  08/20/16 152 lb 3.2 oz (69 kg) (83 %, Z= 0.94)*  04/12/16 151 lb 3.2 oz (68.6 kg) (83 %, Z=  0.94)*  03/26/16 135 lb (61.2 kg) (65 %, Z= 0.38)*   * Growth percentiles are based on CDC 2-20 Years data.       Ct Abdomen Pelvis W Contrast  Result Date: 03/27/2016 CLINICAL DATA:  Right-sided abdominal pain with vomiting EXAM: CT ABDOMEN AND PELVIS WITH CONTRAST TECHNIQUE: Multidetector CT imaging of the abdomen and pelvis was performed using the standard protocol following bolus administration of intravenous contrast. CONTRAST:  100mL ISOVUE-300 IOPAMIDOL (ISOVUE-300) INJECTION 61% COMPARISON:  None. FINDINGS: Lower chest: Lung bases demonstrate no acute infiltrate, consolidation, or pleural effusion. Normal heart size. Hepatobiliary: No focal liver abnormality is seen. No gallstones, gallbladder wall thickening, or biliary dilatation. Pancreas: Unremarkable. No pancreatic ductal dilatation or surrounding inflammatory changes. Spleen: Normal in size without focal abnormality. Adrenals/Urinary Tract: Adrenal glands are unremarkable. Kidneys are normal, without renal calculi, focal lesion, or hydronephrosis. Bladder is unremarkable. Stomach/Bowel: Stomach is nonenlarged. No dilated small bowel. Appendix is enlarged, measuring up to 11 mm and demonstrates wall enhancement and surrounding inflammation, a portion of the appendix is retrocecal in location. No free air. Thickening of adjacent small bowel loops, likely reactive. Vascular/Lymphatic: No significant vascular findings are present. No enlarged abdominal or pelvic lymph nodes. Reproductive: Uterus and bilateral adnexa are unremarkable. Other: Small amount of free fluid within the pelvis.  No free air. Musculoskeletal: No acute or significant osseous findings. IMPRESSION: 1. Findings consistent with acute appendicitis. No free air is seen. A portion of the appendix is retrocecal. 2. Small amount of free fluid in the pelvis. Critical Value/emergent results were called by telephone at the time of interpretation on 03/27/2016 at 3:58 am to Dr. Chiquita LothJADE SUNG  , who verbally acknowledged these results. Electronically Signed   By: Jasmine PangKim  Fujinaga M.D.   On: 03/27/2016 03:58       Assessment & Plan:   Problem List Items Addressed This Visit    Healthcare maintenance    Here to establish care today.  Obtain records from Dr Tracey HarriesPringle.          I spent 30 minutes with the patient and more than 50% of the time was spent in consultation regarding the above. Time spent obtaining information regarding her past history, current issues and plans for follow up.     Dale DurhamSCOTT, Deneene Tarver, MD

## 2016-10-30 DIAGNOSIS — B078 Other viral warts: Secondary | ICD-10-CM | POA: Diagnosis not present

## 2016-10-30 DIAGNOSIS — R208 Other disturbances of skin sensation: Secondary | ICD-10-CM | POA: Diagnosis not present

## 2016-12-02 DIAGNOSIS — B078 Other viral warts: Secondary | ICD-10-CM | POA: Diagnosis not present

## 2016-12-02 DIAGNOSIS — R208 Other disturbances of skin sensation: Secondary | ICD-10-CM | POA: Diagnosis not present

## 2017-01-06 DIAGNOSIS — B078 Other viral warts: Secondary | ICD-10-CM | POA: Diagnosis not present

## 2017-01-06 DIAGNOSIS — L819 Disorder of pigmentation, unspecified: Secondary | ICD-10-CM | POA: Diagnosis not present

## 2017-01-06 DIAGNOSIS — R208 Other disturbances of skin sensation: Secondary | ICD-10-CM | POA: Diagnosis not present

## 2017-01-06 DIAGNOSIS — R234 Changes in skin texture: Secondary | ICD-10-CM | POA: Diagnosis not present

## 2017-07-12 IMAGING — CT CT ABD-PELV W/ CM
2 of 4 series · 15 of 46 positions shown, 17 images · IV contrast (APPLIED)
Comparison: None.

CLINICAL DATA: Right-sided abdominal pain with vomiting

EXAM:
CT ABDOMEN AND PELVIS WITH CONTRAST
TECHNIQUE: Multidetector CT imaging of the abdomen and pelvis was performed
using the standard protocol following bolus administration of
intravenous contrast.
CONTRAST:  100mL LXW0OT-ROO IOPAMIDOL (LXW0OT-ROO) INJECTION 61%

[Series 2: routine abd/pel with · axial · 0.63mm/px · z∈[-938,-528]mm · 12 of 90 slices shown, 14 images]
[im 4/90  soft-tissue]
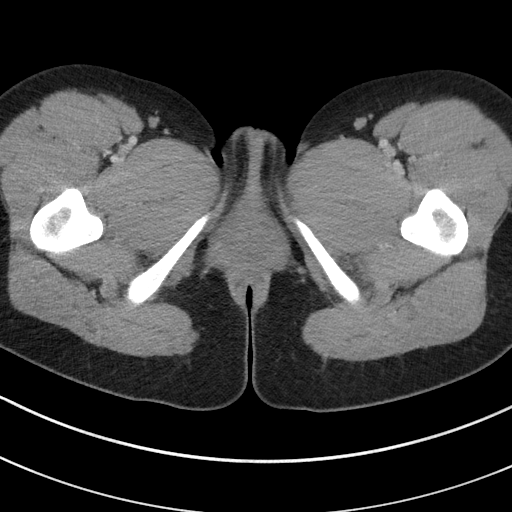
[im 4/90  bone]
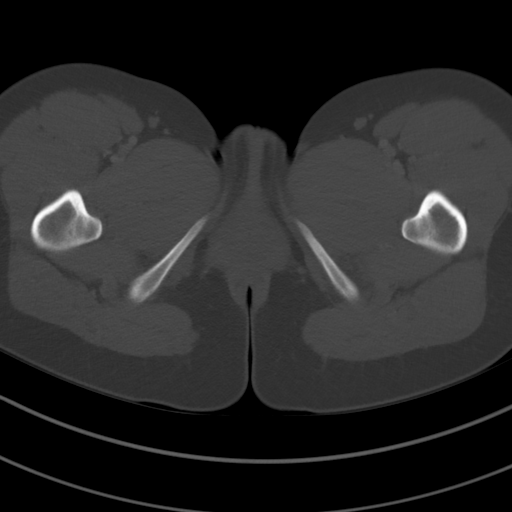
[im 12/90  soft-tissue]
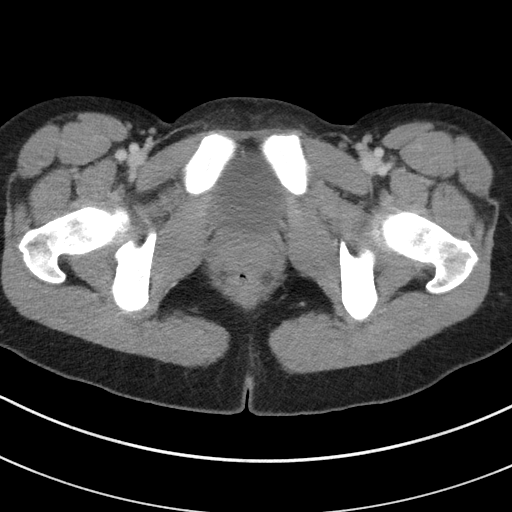
[im 19/90  soft-tissue]
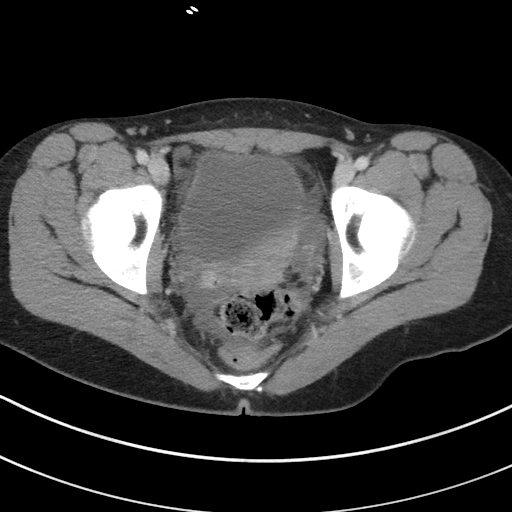
[im 26/90  soft-tissue]
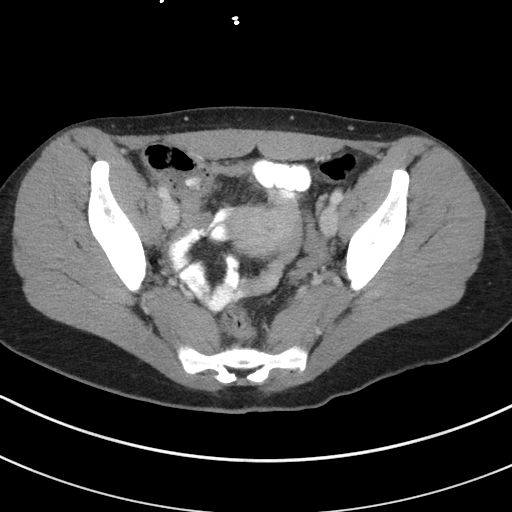
[im 34/90  soft-tissue]
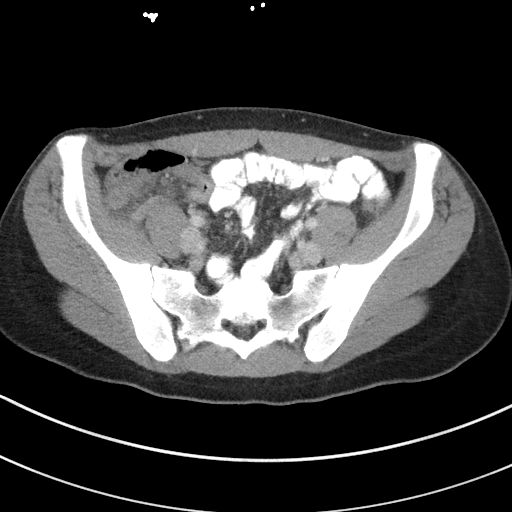
[im 41/90  soft-tissue]
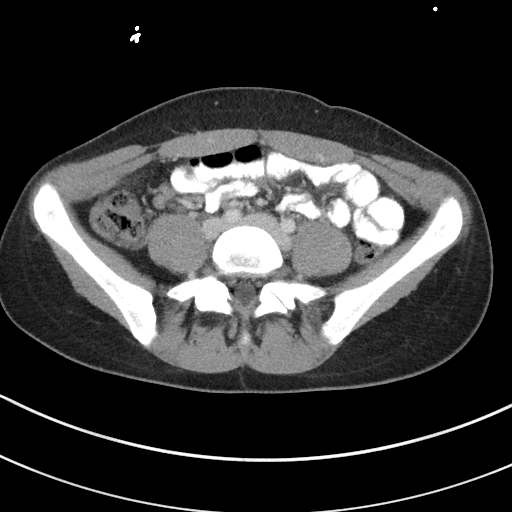
[im 49/90  soft-tissue]
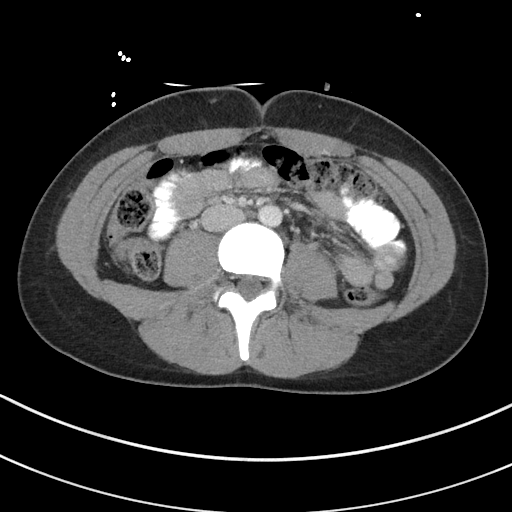
[im 56/90  soft-tissue]
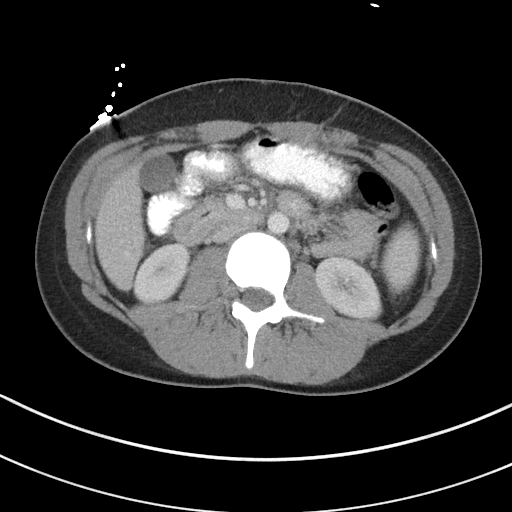
[im 64/90  soft-tissue]
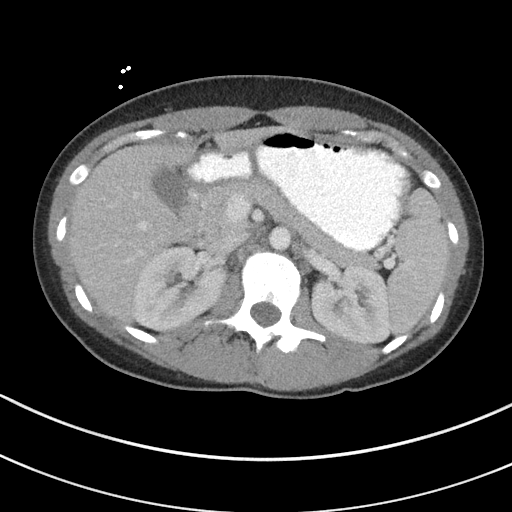
[im 64/90  bone]
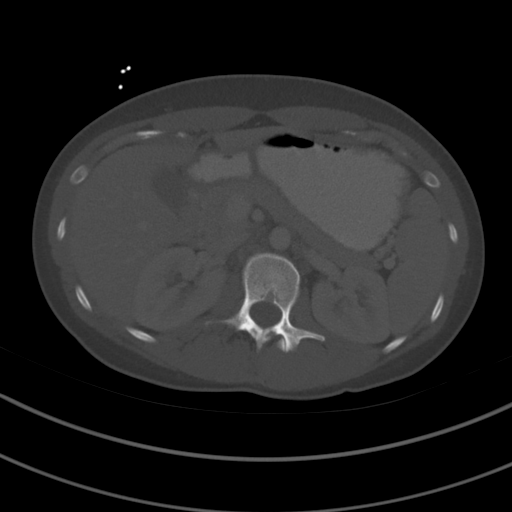
[im 71/90  soft-tissue]
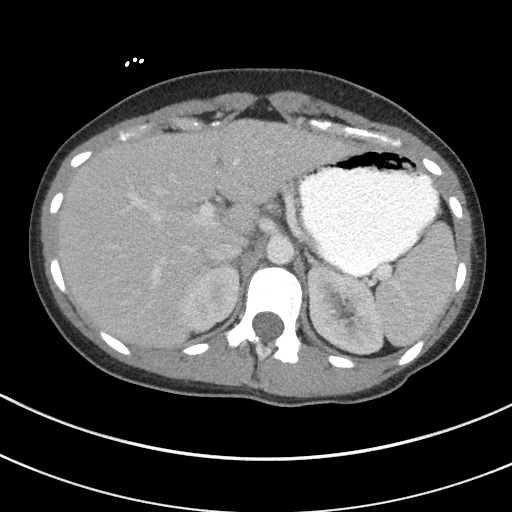
[im 78/90  soft-tissue]
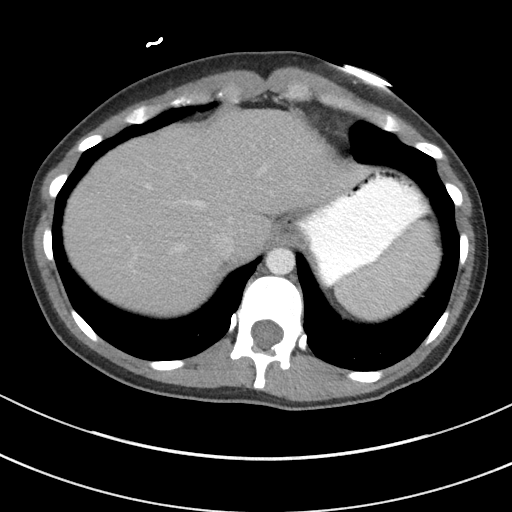
[im 86/90  soft-tissue]
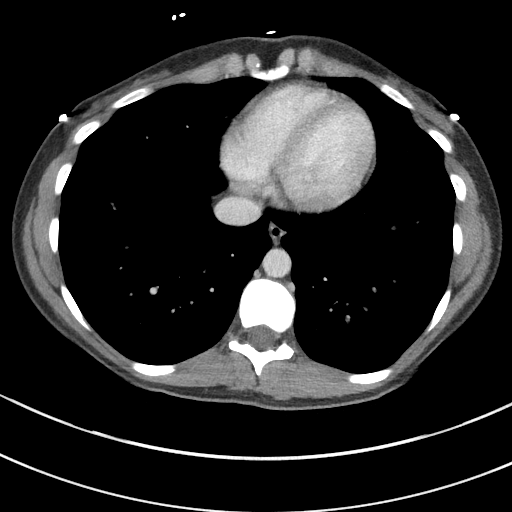

[Series 5: coronal st · coronal · 0.61mm/px · 3 of 77 slices shown]
[im 26/77  soft-tissue]
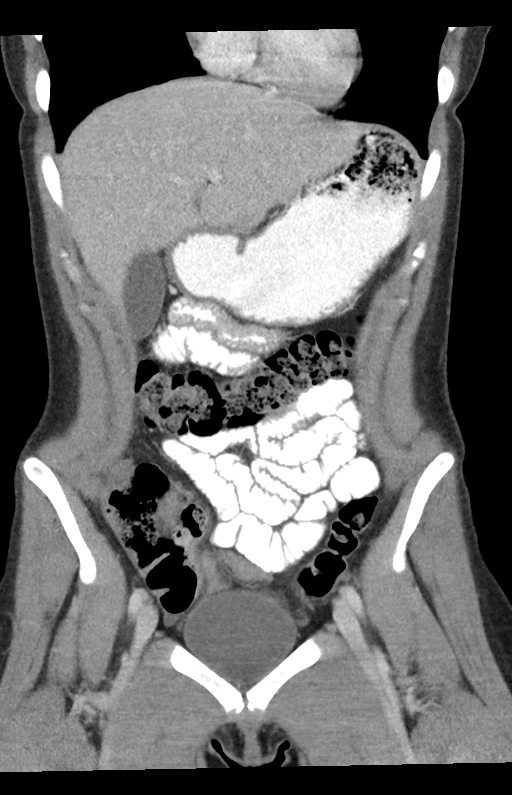
[im 34/77  soft-tissue]
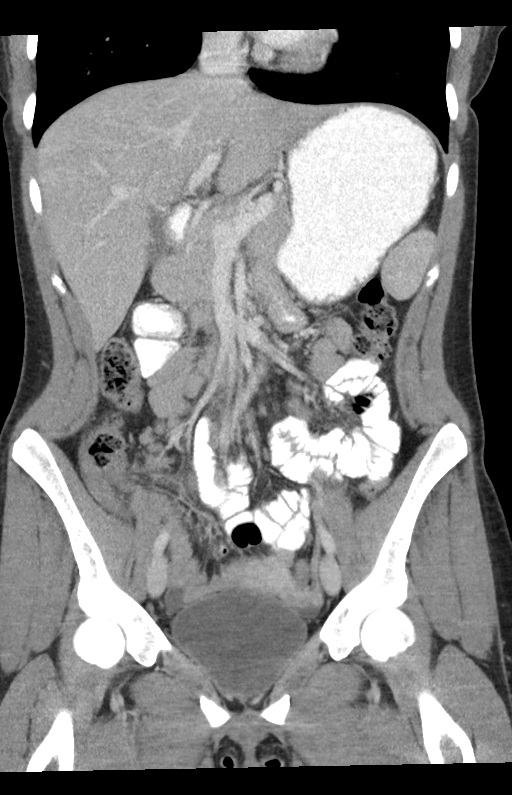
[im 43/77  soft-tissue]
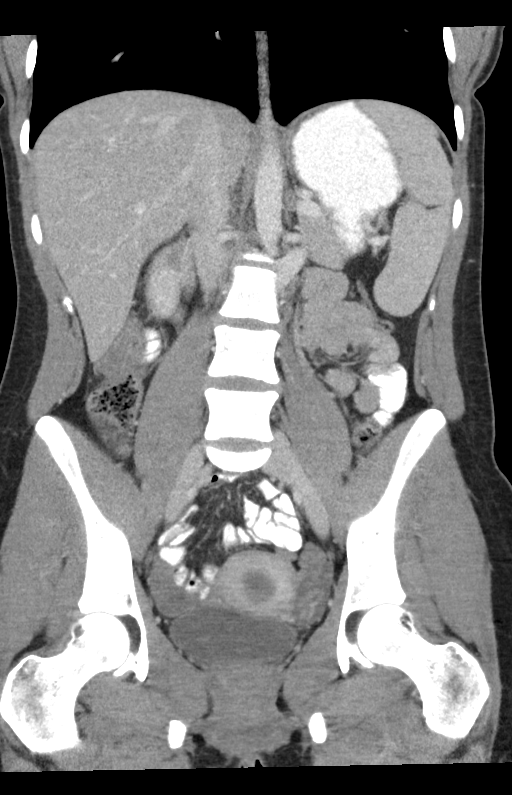

[15 of 46 positions shown; findings below may reference images not displayed]

FINDINGS: Lower chest: Lung bases demonstrate no acute infiltrate,
consolidation, or pleural effusion. Normal heart size.

Hepatobiliary: No focal liver abnormality is seen. No gallstones,
gallbladder wall thickening, or biliary dilatation.

Pancreas: Unremarkable. No pancreatic ductal dilatation or
surrounding inflammatory changes.

Spleen: Normal in size without focal abnormality.

Adrenals/Urinary Tract: Adrenal glands are unremarkable. Kidneys are
normal, without renal calculi, focal lesion, or hydronephrosis.
Bladder is unremarkable.

Stomach/Bowel: Stomach is nonenlarged. No dilated small bowel.
Appendix is enlarged, measuring up to 11 mm and demonstrates wall
enhancement and surrounding inflammation, a portion of the appendix
is retrocecal in location. No free air. Thickening of adjacent small
bowel loops, likely reactive.

Vascular/Lymphatic: No significant vascular findings are present. No
enlarged abdominal or pelvic lymph nodes.

Reproductive: Uterus and bilateral adnexa are unremarkable.

Other: Small amount of free fluid within the pelvis.  No free air.

Musculoskeletal: No acute or significant osseous findings.
IMPRESSION: 1. Findings consistent with acute appendicitis. No free air is seen.
A portion of the appendix is retrocecal.
2. Small amount of free fluid in the pelvis.
Critical Value/emergent results were called by telephone at the time
of interpretation on 03/27/2016 at [DATE] to Dr. SALLAMAARI AKONNIEMI , who
verbally acknowledged these results.

## 2017-08-26 ENCOUNTER — Encounter: Payer: BLUE CROSS/BLUE SHIELD | Admitting: Internal Medicine

## 2018-02-17 DIAGNOSIS — J069 Acute upper respiratory infection, unspecified: Secondary | ICD-10-CM | POA: Diagnosis not present

## 2018-02-17 DIAGNOSIS — R0981 Nasal congestion: Secondary | ICD-10-CM | POA: Diagnosis not present

## 2018-07-24 ENCOUNTER — Telehealth: Payer: Self-pay

## 2018-07-24 NOTE — Telephone Encounter (Signed)
LMTCB. Need to scheduled pt for a yearly physical with Dr. Lorin Picket. [facility] may speak with pt.

## 2019-09-21 ENCOUNTER — Encounter: Payer: Self-pay | Admitting: Internal Medicine

## 2019-09-22 NOTE — Telephone Encounter (Signed)
Called and schedule pt

## 2019-12-07 ENCOUNTER — Ambulatory Visit (INDEPENDENT_AMBULATORY_CARE_PROVIDER_SITE_OTHER): Payer: BC Managed Care – PPO | Admitting: Internal Medicine

## 2019-12-07 ENCOUNTER — Encounter: Payer: Self-pay | Admitting: Internal Medicine

## 2019-12-07 ENCOUNTER — Other Ambulatory Visit: Payer: Self-pay

## 2019-12-07 VITALS — BP 92/80 | HR 85 | Temp 98.5°F | Ht 67.72 in | Wt 152.0 lb

## 2019-12-07 DIAGNOSIS — Z0289 Encounter for other administrative examinations: Secondary | ICD-10-CM | POA: Diagnosis not present

## 2019-12-07 DIAGNOSIS — Z3009 Encounter for other general counseling and advice on contraception: Secondary | ICD-10-CM

## 2019-12-07 DIAGNOSIS — Z0000 Encounter for general adult medical examination without abnormal findings: Secondary | ICD-10-CM

## 2019-12-07 DIAGNOSIS — Z23 Encounter for immunization: Secondary | ICD-10-CM

## 2019-12-07 LAB — URINALYSIS, ROUTINE W REFLEX MICROSCOPIC
Bilirubin Urine: NEGATIVE
Leukocytes,Ua: NEGATIVE
Nitrite: NEGATIVE
Specific Gravity, Urine: >=1.03 — AB (ref 1.000–1.030)
Total Protein, Urine: NEGATIVE
Urine Glucose: NEGATIVE
Urobilinogen, UA: 0.2 (ref 0.0–1.0)
pH: 5.5 (ref 5.0–8.0)

## 2019-12-07 LAB — HEMOGLOBIN: Hemoglobin: 13.5 g/dL (ref 12.0–15.0)

## 2019-12-07 MED ORDER — NORETHINDRONE ACET-ETHINYL EST 1-20 MG-MCG PO TABS: 1.0000 | ORAL_TABLET | Freq: Every day | ORAL | 6 refills | Status: DC

## 2019-12-07 NOTE — Progress Notes (Signed)
Physical Examination form received. It has been labeled and placed in Trisha's To Do folder on her desk. [facility] Immunization record has been attached.

## 2019-12-07 NOTE — Progress Notes (Signed)
Patient ID: Diana Moss, female   DOB: 1995-11-28, 24 y.o.   MRN: [id number]   Subjective:    Patient ID: Diana Moss, female    DOB: 08/07/1995, 24 y.o.   MRN: [id number]  HPI This visit occurred during the SARS-CoV-2 public health emergency.  Safety protocols were in place, including screening questions prior to the visit, additional usage of staff PPE, and extensive cleaning of exam room while observing appropriate contact time as indicated for disinfecting solutions.  Patient here for school physical.  She reports she is doing well.  Feels good.  Staying active.  No chest pain or sob reported with increased activity or exertion.  No acid reflux.  No abdominal pain.  Bowels moving.  Discussed birth control.  Wants to start.  Wants oral contraceptives.  Discussed risks and possible side effects of the medication.  Planning for PT school.  Needs form completed.     Past Medical History:  Diagnosis Date  . Healthy adolescent    Past Surgical History:  Procedure Laterality Date  . APPENDECTOMY    . LAPAROSCOPIC APPENDECTOMY N/A 03/27/2016   Procedure: APPENDECTOMY LAPAROSCOPIC;  Surgeon: Henrene Dodge, MD;  Location: [facility] ORS;  Service: General;  Laterality: N/A;   Family History  Problem Relation Age of Onset  . Breast cancer Maternal Grandfather        Maternal GGM and Maternal Great aunt   . Lung cancer Paternal Grandmother   . Alcohol abuse Neg Hx   . Arthritis Neg Hx   . Asthma Neg Hx   . Birth defects Neg Hx   . Cancer Neg Hx   . COPD Neg Hx   . Depression Neg Hx   . Diabetes Neg Hx   . Drug abuse Neg Hx   . Early death Neg Hx   . Hearing loss Neg Hx   . Heart disease Neg Hx   . Hyperlipidemia Neg Hx   . Hypertension Neg Hx   . Kidney disease Neg Hx   . Learning disabilities Neg Hx   . Mental illness Neg Hx   . Mental retardation Neg Hx   . Miscarriages / Stillbirths Neg Hx   . Stroke Neg Hx   . Vision loss Neg Hx   . Varicose Veins Neg Hx    Social History    Socioeconomic History  . Marital status: Single    Spouse name: Not on file  . Number of children: Not on file  . Years of education: Not on file  . Highest education level: Not on file  Occupational History  . Not on file  Tobacco Use  . Smoking status: Never Smoker  . Smokeless tobacco: Never Used  Substance and Sexual Activity  . Alcohol use: No  . Drug use: No  . Sexual activity: Never  Other Topics Concern  . Not on file  Social History Narrative  . Not on file   Social Determinants of Health   Financial Resource Strain:   . Difficulty of Paying Living Expenses: Not on file  Food Insecurity:   . Worried About Programme researcher, broadcasting/film/video in the Last Year: Not on file  . Ran Out of Food in the Last Year: Not on file  Transportation Needs:   . Lack of Transportation (Medical): Not on file  . Lack of Transportation (Non-Medical): Not on file  Physical Activity:   . Days of Exercise per Week: Not on file  . Minutes of Exercise per Session: Not  on file  Stress:   . Feeling of Stress : Not on file  Social Connections:   . Frequency of Communication with Friends and Family: Not on file  . Frequency of Social Gatherings with Friends and Family: Not on file  . Attends Religious Services: Not on file  . Active Member of Clubs or Organizations: Not on file  . Attends Banker Meetings: Not on file  . Marital Status: Not on file    Outpatient Encounter Medications as of 12/07/2019  Medication Sig  . norethindrone-ethinyl estradiol (LOESTRIN 1/20, 21,) 1-20 MG-MCG tablet Take 1 tablet by mouth daily.   No facility-administered encounter medications on file as of 12/07/2019.   Review of Systems  Constitutional: Negative for appetite change and unexpected weight change.  HENT: Negative for congestion and sinus pressure.   Eyes: Negative for pain and visual disturbance.  Respiratory: Negative for cough, chest tightness and shortness of breath.   Cardiovascular:  Negative for chest pain, palpitations and leg swelling.  Gastrointestinal: Negative for abdominal pain, diarrhea, nausea and vomiting.  Genitourinary: Negative for difficulty urinating and dysuria.  Musculoskeletal: Negative for joint swelling and myalgias.  Skin: Negative for color change and rash.  Neurological: Negative for dizziness, light-headedness and headaches.  Hematological: Negative for adenopathy. Does not bruise/bleed easily.  Psychiatric/Behavioral: Negative for agitation and dysphoric mood.       Objective:    Physical Exam Vitals reviewed.  Constitutional:      General: She is not in acute distress.    Appearance: Normal appearance.  HENT:     Head: Normocephalic and atraumatic.     Right Ear: External ear normal.     Left Ear: External ear normal.  Eyes:     General: No scleral icterus.       Right eye: No discharge.        Left eye: No discharge.     Conjunctiva/sclera: Conjunctivae normal.  Neck:     Thyroid: No thyromegaly.  Cardiovascular:     Rate and Rhythm: Normal rate and regular rhythm.  Pulmonary:     Effort: No respiratory distress.     Breath sounds: Normal breath sounds. No wheezing.  Abdominal:     General: Bowel sounds are normal.     Palpations: Abdomen is soft.     Tenderness: There is no abdominal tenderness.  Genitourinary:    Comments: Pt declined.  Musculoskeletal:        General: No swelling or tenderness.     Cervical back: Neck supple. No tenderness.  Lymphadenopathy:     Cervical: No cervical adenopathy.  Skin:    Findings: No erythema or rash.  Neurological:     Mental Status: She is alert.  Psychiatric:        Mood and Affect: Mood normal.        Behavior: Behavior normal.     BP 92/80 (BP Location: Left Arm, Patient Position: Sitting)   Pulse 85   Temp 98.5 F (36.9 C)   Ht 5' 7.72" (1.72 m)   Wt 152 lb (68.9 kg)   SpO2 99%   BMI 23.31 kg/m  Wt Readings from Last 3 Encounters:  12/07/19 152 lb (68.9 kg)   08/20/16 152 lb 3.2 oz (69 kg) (83 %, Z= 0.94)*  04/12/16 151 lb 3.2 oz (68.6 kg) (83 %, Z= 0.94)*   * Growth percentiles are based on CDC (Girls, 2-20 Years) data.     Lab Results  Component Value Date  WBC 19.5 (H) 03/26/2016   HGB 13.5 12/07/2019   HCT 40.0 03/26/2016   PLT 253 03/26/2016   GLUCOSE 144 (H) 03/26/2016   ALT 18 03/26/2016   AST 25 03/26/2016   NA 136 03/26/2016   K 3.1 (L) 03/26/2016   CL 108 03/26/2016   CREATININE 0.73 03/26/2016   BUN 13 03/26/2016   CO2 18 (L) 03/26/2016        Assessment & Plan:   Problem List Items Addressed This Visit    Healthcare maintenance    School physical today.  Declined pelvic and pap smear.  Wants to see gyn.        Encounter for completion of form with patient    School physical form - exam for completion.        Relevant Orders   Urinalysis, Routine w reflex microscopic (Completed)   Hemoglobin (Completed)   Birth control counseling    Discussed options.  She prefers oral contraceptives.  Will start Sunday after next period.  Discussed possible risks and side effects of the medication.  Recheck blood pressure.  Follow.         Other Visit Diagnoses    Need for influenza vaccination    -  Primary   Relevant Orders   Flu Vaccine QUAD 6+ mos PF IM (Fluarix Quad PF) (Completed)       Dale Greenhorn, MD

## 2019-12-12 ENCOUNTER — Encounter: Payer: Self-pay | Admitting: Internal Medicine

## 2019-12-12 DIAGNOSIS — Z3009 Encounter for other general counseling and advice on contraception: Secondary | ICD-10-CM | POA: Insufficient documentation

## 2019-12-12 NOTE — Assessment & Plan Note (Signed)
Discussed options.  She prefers oral contraceptives.  Will start Sunday after next period.  Discussed possible risks and side effects of the medication.  Recheck blood pressure.  Follow.

## 2019-12-12 NOTE — Assessment & Plan Note (Signed)
School physical form - exam for completion.

## 2019-12-12 NOTE — Assessment & Plan Note (Signed)
School physical today.  Declined pelvic and pap smear.  Wants to see gyn.

## 2019-12-15 ENCOUNTER — Encounter: Payer: Self-pay | Admitting: Internal Medicine

## 2019-12-29 NOTE — Telephone Encounter (Signed)
Lm for pt to call back and schedule a NV

## 2019-12-29 NOTE — Telephone Encounter (Signed)
Please schedule her for NV

## 2019-12-29 NOTE — Telephone Encounter (Signed)
Ok to schedule a tetanus booster and TB skin test.

## 2020-01-04 ENCOUNTER — Ambulatory Visit (INDEPENDENT_AMBULATORY_CARE_PROVIDER_SITE_OTHER): Payer: BC Managed Care – PPO

## 2020-01-04 ENCOUNTER — Other Ambulatory Visit: Payer: Self-pay

## 2020-01-04 DIAGNOSIS — Z23 Encounter for immunization: Secondary | ICD-10-CM | POA: Diagnosis not present

## 2020-01-04 DIAGNOSIS — Z111 Encounter for screening for respiratory tuberculosis: Secondary | ICD-10-CM | POA: Diagnosis not present

## 2020-01-04 NOTE — Progress Notes (Addendum)
Patient presented for Tdap injection to leftt deltoid and PPD placement to Left forearm patient voiced no concerns nor showed any signs of distress during injection.  Reviewed.  Dr Lorin Picket

## 2020-01-06 ENCOUNTER — Other Ambulatory Visit: Payer: Self-pay

## 2020-01-06 ENCOUNTER — Ambulatory Visit (INDEPENDENT_AMBULATORY_CARE_PROVIDER_SITE_OTHER): Payer: BC Managed Care – PPO

## 2020-01-06 DIAGNOSIS — Z111 Encounter for screening for respiratory tuberculosis: Secondary | ICD-10-CM

## 2020-01-06 LAB — TB SKIN TEST
Induration: 0 mm
TB Skin Test: NEGATIVE

## 2020-01-06 NOTE — Progress Notes (Addendum)
Patient came in for PPD skin test read. Results where 12mm. PPD test Negative  Reviewed.  Dr Lorin Picket

## 2020-01-28 DIAGNOSIS — J069 Acute upper respiratory infection, unspecified: Secondary | ICD-10-CM | POA: Diagnosis not present

## 2020-02-10 ENCOUNTER — Ambulatory Visit: Payer: BC Managed Care – PPO | Admitting: Internal Medicine

## 2020-03-01 ENCOUNTER — Encounter: Payer: Self-pay | Admitting: Internal Medicine

## 2020-03-02 NOTE — Telephone Encounter (Signed)
Form signed and placed in box.  (Trisha's box - at desk).

## 2020-03-06 NOTE — Telephone Encounter (Signed)
Left detailed message for patient on VM

## 2020-06-30 ENCOUNTER — Other Ambulatory Visit: Payer: Self-pay | Admitting: Internal Medicine

## 2020-08-09 ENCOUNTER — Other Ambulatory Visit: Payer: Self-pay | Admitting: Internal Medicine

## 2020-10-20 ENCOUNTER — Other Ambulatory Visit: Payer: Self-pay | Admitting: Internal Medicine

## 2020-12-11 ENCOUNTER — Other Ambulatory Visit: Payer: Self-pay | Admitting: Internal Medicine

## 2021-01-15 ENCOUNTER — Other Ambulatory Visit: Payer: Self-pay | Admitting: Internal Medicine

## 2021-04-05 ENCOUNTER — Encounter: Payer: Self-pay | Admitting: Internal Medicine

## 2021-04-05 DIAGNOSIS — Z111 Encounter for screening for respiratory tuberculosis: Secondary | ICD-10-CM

## 2021-04-10 NOTE — Telephone Encounter (Signed)
Pt called wanting and update of the immunization form that she needs filled out. Pt need chest xray, tb test, flu shot and IGRA test need it back by the end of January

## 2021-04-10 NOTE — Telephone Encounter (Signed)
Please print her attached form and complete what we have of her immunizations.  We can then see what she needs.

## 2021-04-12 NOTE — Telephone Encounter (Signed)
Noted.  Let me know if I need to do anything.  ?

## 2021-04-12 NOTE — Telephone Encounter (Signed)
Spoke with patient. Advised if cxr is needed then she would have to see Dr Lorin Picket. Scheduled patient for NV on 1/25 to come in for flu shot, PPD placement and blood draw for Quantiferon gold and then 1/27 for PPD read. The rest of the form has been completed. Cannot complete TB portion until TB skin test is read

## 2021-04-13 NOTE — Telephone Encounter (Signed)
Quantiferon gold ordered. Will need TB skin test and flu shot ordered when she comes in.

## 2021-04-18 ENCOUNTER — Ambulatory Visit (INDEPENDENT_AMBULATORY_CARE_PROVIDER_SITE_OTHER): Payer: BC Managed Care – PPO | Admitting: *Deleted

## 2021-04-18 ENCOUNTER — Other Ambulatory Visit: Payer: Self-pay

## 2021-04-18 DIAGNOSIS — Z23 Encounter for immunization: Secondary | ICD-10-CM | POA: Diagnosis not present

## 2021-04-18 DIAGNOSIS — Z111 Encounter for screening for respiratory tuberculosis: Secondary | ICD-10-CM | POA: Diagnosis not present

## 2021-04-20 ENCOUNTER — Other Ambulatory Visit: Payer: Self-pay

## 2021-04-20 ENCOUNTER — Ambulatory Visit: Payer: BC Managed Care – PPO | Admitting: *Deleted

## 2021-04-20 DIAGNOSIS — Z111 Encounter for screening for respiratory tuberculosis: Secondary | ICD-10-CM

## 2021-04-20 NOTE — Progress Notes (Signed)
PPD read:  Negative

## 2021-11-14 ENCOUNTER — Other Ambulatory Visit: Payer: Self-pay | Admitting: Internal Medicine

## 2022-09-09 ENCOUNTER — Encounter: Payer: Self-pay | Admitting: Internal Medicine

## 2022-11-07 ENCOUNTER — Encounter (INDEPENDENT_AMBULATORY_CARE_PROVIDER_SITE_OTHER): Payer: Self-pay

## 2022-12-04 ENCOUNTER — Encounter: Payer: Self-pay | Admitting: Internal Medicine

## 2022-12-20 ENCOUNTER — Encounter: Payer: BC Managed Care – PPO | Admitting: Internal Medicine

## 2023-07-17 ENCOUNTER — Encounter: Payer: Self-pay | Admitting: Internal Medicine

## 2023-12-24 ENCOUNTER — Ambulatory Visit: Payer: Self-pay | Admitting: Internal Medicine

## 2023-12-24 ENCOUNTER — Encounter: Payer: Self-pay | Admitting: Internal Medicine

## 2023-12-24 ENCOUNTER — Ambulatory Visit: Admitting: Internal Medicine

## 2023-12-24 VITALS — BP 118/70 | HR 75 | Temp 98.0°F | Resp 16 | Ht 68.0 in | Wt 198.4 lb

## 2023-12-24 DIAGNOSIS — R635 Abnormal weight gain: Secondary | ICD-10-CM

## 2023-12-24 DIAGNOSIS — Z0000 Encounter for general adult medical examination without abnormal findings: Secondary | ICD-10-CM | POA: Diagnosis not present

## 2023-12-24 DIAGNOSIS — Z3009 Encounter for other general counseling and advice on contraception: Secondary | ICD-10-CM

## 2023-12-24 DIAGNOSIS — Z01419 Encounter for gynecological examination (general) (routine) without abnormal findings: Secondary | ICD-10-CM | POA: Insufficient documentation

## 2023-12-24 LAB — CBC WITH DIFFERENTIAL/PLATELET
Basophils Absolute: 0 K/uL (ref 0.0–0.1)
Basophils Relative: 0.7 % (ref 0.0–3.0)
Eosinophils Absolute: 0.2 K/uL (ref 0.0–0.7)
Eosinophils Relative: 2.7 % (ref 0.0–5.0)
HCT: 40.3 % (ref 36.0–46.0)
Hemoglobin: 13.8 g/dL (ref 12.0–15.0)
Lymphocytes Relative: 32.7 % (ref 12.0–46.0)
Lymphs Abs: 2.2 K/uL (ref 0.7–4.0)
MCHC: 34.2 g/dL (ref 30.0–36.0)
MCV: 91 fl (ref 78.0–100.0)
Monocytes Absolute: 0.6 K/uL (ref 0.1–1.0)
Monocytes Relative: 9.6 % (ref 3.0–12.0)
Neutro Abs: 3.6 K/uL (ref 1.4–7.7)
Neutrophils Relative %: 54.3 % (ref 43.0–77.0)
Platelets: 249 K/uL (ref 150.0–400.0)
RBC: 4.43 Mil/uL (ref 3.87–5.11)
RDW: 12.7 % (ref 11.5–15.5)
WBC: 6.7 K/uL (ref 4.0–10.5)

## 2023-12-24 LAB — COMPREHENSIVE METABOLIC PANEL WITH GFR
ALT: 12 U/L (ref 0–35)
AST: 12 U/L (ref 0–37)
Albumin: 4.3 g/dL (ref 3.5–5.2)
Alkaline Phosphatase: 48 U/L (ref 39–117)
BUN: 14 mg/dL (ref 6–23)
CO2: 23 meq/L (ref 19–32)
Calcium: 9.6 mg/dL (ref 8.4–10.5)
Chloride: 107 meq/L (ref 96–112)
Creatinine, Ser: 0.76 mg/dL (ref 0.40–1.20)
GFR: 107.85 mL/min (ref >60.00)
Glucose, Bld: 85 mg/dL (ref 70–99)
Potassium: 4.1 meq/L (ref 3.5–5.1)
Sodium: 139 meq/L (ref 135–145)
Total Bilirubin: 0.6 mg/dL (ref 0.2–1.2)
Total Protein: 6.3 g/dL (ref 6.0–8.3)

## 2023-12-24 LAB — TSH: TSH: 2.51 u[IU]/mL (ref 0.35–5.50)

## 2023-12-24 LAB — LIPID PANEL
Cholesterol: 163 mg/dL (ref 0–200)
HDL: 57.8 mg/dL (ref >39.00)
LDL Cholesterol: 88 mg/dL (ref 0–99)
NonHDL: 105.69
Total CHOL/HDL Ratio: 3
Triglycerides: 90 mg/dL (ref 0.0–149.0)
VLDL: 18 mg/dL (ref 0.0–40.0)

## 2023-12-24 MED ORDER — NORETHIN ACE-ETH ESTRAD-FE 1-20 MG-MCG PO TABS: 1.0000 | ORAL_TABLET | Freq: Every day | ORAL | 5 refills | Status: AC

## 2023-12-24 NOTE — Assessment & Plan Note (Signed)
 Tolerated ocp's previously. Request to restart. Will start loestrin 1/20. Follow. Follow pressures. Notify me if any problems.

## 2023-12-24 NOTE — Progress Notes (Signed)
 Subjective:    Patient ID: Diana Moss, female    DOB: 12/22/1995, 28 y.o.   MRN: [id number]  Patient here for  Chief Complaint  Patient presents with   Medical Management of Chronic Issues    HPI Here for a physical exam. Have not seen her since 11/2019. Finished PT school. Working at [employer]. Engaged. Goes to the gym 2x/week. Walking daily - one mile per day and stays active at work. No sob. Breathing stable. No change in bowels. She is concerned regarding weight gain. Happened over a short period of time a few years ago. Relatively stable recently. Periods regular. LMP 11/29/23 - 12/04/23. Would like to start back on OCPs. Had no problems taking previously.    Past Medical History:  Diagnosis Date   Healthy adolescent    Past Surgical History:  Procedure Laterality Date   APPENDECTOMY     LAPAROSCOPIC APPENDECTOMY N/A 03/27/2016   Procedure: APPENDECTOMY LAPAROSCOPIC;  Surgeon: Aloysius Plant, MD;  Location: [facility] OR[facility];  Service: General;  Laterality: N/A;   Family History  Problem Relation Age of Onset   Breast cancer Maternal Grandfather        Maternal GGM and Maternal Great aunt    Lung cancer Paternal Grandmother    Alcohol abuse Neg Hx    Arthritis Neg Hx    Asthma Neg Hx    Birth defects Neg Hx    Cancer Neg Hx    COPD Neg Hx    Depression Neg Hx    Diabetes Neg Hx    Drug abuse Neg Hx    Early death Neg Hx    Hearing loss Neg Hx    Heart disease Neg Hx    Hyperlipidemia Neg Hx    Hypertension Neg Hx    Kidney disease Neg Hx    Learning disabilities Neg Hx    Mental illness Neg Hx    Mental retardation Neg Hx    Miscarriages / Stillbirths Neg Hx    Stroke Neg Hx    Vision loss Neg Hx    Varicose Veins Neg Hx    Social History   Socioeconomic History   Marital status: Single    Spouse name: Not on file   Number of children: Not on file   Years of education: Not on file   Highest education level: Not on file  Occupational History   Not on file   Tobacco Use   Smoking status: Never   Smokeless tobacco: Never  Substance and Sexual Activity   Alcohol use: No   Drug use: No   Sexual activity: Never  Other Topics Concern   Not on file  Social History Narrative   Not on file   Social Drivers of Health   Financial Resource Strain: Not on file  Food Insecurity: Not on file  Transportation Needs: Not on file  Physical Activity: Not on file  Stress: Not on file  Social Connections: Not on file     Review of Systems  Constitutional:  Negative for appetite change and unexpected weight change.  HENT:  Negative for congestion and sinus pressure.   Respiratory:  Negative for cough, chest tightness and shortness of breath.   Cardiovascular:  Negative for chest pain, palpitations and leg swelling.  Gastrointestinal:  Negative for abdominal pain, diarrhea, nausea and vomiting.  Genitourinary:  Negative for difficulty urinating and dysuria.  Musculoskeletal:  Negative for joint swelling and myalgias.  Skin:  Negative for color change and  rash.  Neurological:  Negative for dizziness and headaches.  Psychiatric/Behavioral:  Negative for agitation and dysphoric mood.        Objective:     BP 118/70   Pulse 75   Temp 98 F (36.7 C)   Resp 16   Ht 5' 8 (1.727 m)   Wt 198 lb 6.4 oz (90 kg)   SpO2 99%   BMI 30.17 kg/m  Wt Readings from Last 3 Encounters:  12/24/23 198 lb 6.4 oz (90 kg)  12/07/19 152 lb (68.9 kg)  08/20/16 152 lb 3.2 oz (69 kg) (83%, Z= 0.94)*   * Growth percentiles are based on CDC (Girls, 2-20 Years) data.    Physical Exam Vitals reviewed.  Constitutional:      General: She is not in acute distress.    Appearance: Normal appearance.  HENT:     Head: Normocephalic and atraumatic.     Right Ear: External ear normal.     Left Ear: External ear normal.     Mouth/Throat:     Pharynx: No oropharyngeal exudate or posterior oropharyngeal erythema.  Eyes:     General: No scleral icterus.       Right  eye: No discharge.        Left eye: No discharge.     Conjunctiva/sclera: Conjunctivae normal.  Neck:     Thyroid: No thyromegaly.  Cardiovascular:     Rate and Rhythm: Normal rate and regular rhythm.  Pulmonary:     Effort: No respiratory distress.     Breath sounds: Normal breath sounds. No wheezing.  Abdominal:     General: Bowel sounds are normal.     Palpations: Abdomen is soft.     Tenderness: There is no abdominal tenderness.  Musculoskeletal:        General: No swelling or tenderness.     Cervical back: Neck supple. No tenderness.  Lymphadenopathy:     Cervical: No cervical adenopathy.  Skin:    Findings: No erythema or rash.  Neurological:     Mental Status: She is alert.  Psychiatric:        Mood and Affect: Mood normal.        Behavior: Behavior normal.         Outpatient Encounter Medications as of 12/24/2023  Medication Sig   norethindrone -ethinyl estradiol-FE (LOESTRIN FE 1/20) 1-20 MG-MCG tablet Take 1 tablet by mouth daily.   [DISCONTINUED] norethindrone -ethinyl estradiol (LOESTRIN) 1-20 MG-MCG tablet TAKE 1 TABLET BY MOUTH EVERY DAY   No facility-administered encounter medications on file as of 12/24/2023.     Lab Results  Component Value Date   WBC 19.5 (H) 03/26/2016   HGB 13.5 12/07/2019   HCT 40.0 03/26/2016   PLT 253 03/26/2016   GLUCOSE 144 (H) 03/26/2016   ALT 18 03/26/2016   AST 25 03/26/2016   NA 136 03/26/2016   K 3.1 (L) 03/26/2016   CL 108 03/26/2016   CREATININE 0.73 03/26/2016   BUN 13 03/26/2016   CO2 18 (L) 03/26/2016    CT Abdomen Pelvis W Contrast Result Date: 03/27/2016 CLINICAL DATA:  Right-sided abdominal pain with vomiting EXAM: CT ABDOMEN AND PELVIS WITH CONTRAST TECHNIQUE: Multidetector CT imaging of the abdomen and pelvis was performed using the standard protocol following bolus administration of intravenous contrast. CONTRAST:  ISOVUE -300 IOPAMIDOL  (ISOVUE -300) INJECTION 61% COMPARISON:  None. FINDINGS: Lower  chest: Lung bases demonstrate no acute infiltrate, consolidation, or pleural effusion. Normal heart size. Hepatobiliary: No focal liver abnormality is  seen. No gallstones, gallbladder wall thickening, or biliary dilatation. Pancreas: Unremarkable. No pancreatic ductal dilatation or surrounding inflammatory changes. Spleen: Normal in size without focal abnormality. Adrenals/Urinary Tract: Adrenal glands are unremarkable. Kidneys are normal, without renal calculi, focal lesion, or hydronephrosis. Bladder is unremarkable. Stomach/Bowel: Stomach is nonenlarged. No dilated small bowel. Appendix is enlarged, measuring up to 11 mm and demonstrates wall enhancement and surrounding inflammation, a portion of the appendix is retrocecal in location. No free air. Thickening of adjacent small bowel loops, likely reactive. Vascular/Lymphatic: No significant vascular findings are present. No enlarged abdominal or pelvic lymph nodes. Reproductive: Uterus and bilateral adnexa are unremarkable. Other: Small amount of free fluid within the pelvis.  No free air. Musculoskeletal: No acute or significant osseous findings. IMPRESSION: 1. Findings consistent with acute appendicitis. No free air is seen. A portion of the appendix is retrocecal. 2. Small amount of free fluid in the pelvis. Critical Value/emergent results were called by telephone at the time of interpretation on 03/27/2016 at 3:58 am to Dr. VERMELL GROOMS , who verbally acknowledged these results. Electronically Signed   By: Luke Bun M.D.   On: 03/27/2016 03:58       Assessment & Plan:  Weight gain Assessment & Plan: Weight gain as outlined. Discussed. Will check cbc, met c and tsh. Discussed continuing diet and exercise. Will notify me if desires referral to weight management program.   Orders: -     CBC with Differential/Platelet -     Comprehensive metabolic panel with GFR -     Lipid panel -     TSH  Birth control counseling Assessment & Plan: Tolerated  ocp's previously. Request to restart. Will start loestrin 1/20. Follow. Follow pressures. Notify me if any problems.    Encounter for gynecological examination without abnormal finding Assessment & Plan: Discussed pap smear. She prefers to see gyn. Referral placed.   Orders: -     Ambulatory referral to Gynecology  Other orders -     Norethin Ace-Eth Estrad-FE; Take 1 tablet by mouth daily.  Dispense: 28 tablet; Refill: 5     Allena Hamilton, MD

## 2023-12-24 NOTE — Assessment & Plan Note (Signed)
 Discussed pap smear. She prefers to see gyn. Referral placed.

## 2023-12-24 NOTE — Assessment & Plan Note (Signed)
 Weight gain as outlined. Discussed. Will check cbc, met c and tsh. Discussed continuing diet and exercise. Will notify me if desires referral to weight management program.

## 2023-12-29 NOTE — Telephone Encounter (Signed)
 Please call her and inform her that I can refer her to [facility] weight and wellness program. Physician run program.

## 2024-04-09 ENCOUNTER — Ambulatory Visit: Admitting: Internal Medicine

## 2024-04-09 DIAGNOSIS — R635 Abnormal weight gain: Secondary | ICD-10-CM

## 2024-06-18 ENCOUNTER — Ambulatory Visit: Admitting: Internal Medicine
